# Patient Record
Sex: Female | Born: 1995 | Race: White | Hispanic: Yes | Marital: Married
Health system: Southern US, Community
[De-identification: ages and names within clinical notes are randomized; demographics above are authoritative.]

---

## 2020-01-11 ENCOUNTER — Emergency Department (HOSPITAL_COMMUNITY): Payer: No Typology Code available for payment source

## 2020-01-11 ENCOUNTER — Emergency Department (HOSPITAL_COMMUNITY)
Admission: EM | Admit: 2020-01-11 | Discharge: 2020-01-11 | Disposition: A | Discharge status: Home / Self Care | Payer: No Typology Code available for payment source | Attending: Emergency Medicine | Admitting: Emergency Medicine

## 2020-01-11 ENCOUNTER — Other Ambulatory Visit: Payer: Self-pay

## 2020-01-11 DIAGNOSIS — R0789 Other chest pain: Secondary | ICD-10-CM | POA: Insufficient documentation

## 2020-01-11 DIAGNOSIS — Y9389 Activity, other specified: Secondary | ICD-10-CM | POA: Diagnosis not present

## 2020-01-11 DIAGNOSIS — R1013 Epigastric pain: Secondary | ICD-10-CM | POA: Insufficient documentation

## 2020-01-11 DIAGNOSIS — Y92410 Unspecified street and highway as the place of occurrence of the external cause: Secondary | ICD-10-CM | POA: Diagnosis not present

## 2020-01-11 DIAGNOSIS — Y999 Unspecified external cause status: Secondary | ICD-10-CM | POA: Insufficient documentation

## 2020-01-11 LAB — COMPREHENSIVE METABOLIC PANEL
ALT: 60 U/L — ABNORMAL HIGH (ref 0–44)
AST: 121 U/L — ABNORMAL HIGH (ref 15–41)
Albumin: 3.7 g/dL (ref 3.5–5.0)
Alkaline Phosphatase: 76 U/L (ref 38–126)
Anion gap: 10 (ref 5–15)
BUN: 6 mg/dL (ref 6–20)
CO2: 23 mmol/L (ref 22–32)
Calcium: 8.1 mg/dL — ABNORMAL LOW (ref 8.9–10.3)
Chloride: 110 mmol/L (ref 98–111)
Creatinine, Ser: 0.61 mg/dL (ref 0.44–1.00)
GFR calc Af Amer: >60 mL/min (ref >60)
GFR calc non Af Amer: >60 mL/min (ref >60)
Glucose, Bld: 108 mg/dL — ABNORMAL HIGH (ref 70–99)
Potassium: 3.8 mmol/L (ref 3.5–5.1)
Sodium: 143 mmol/L (ref 135–145)
Total Bilirubin: 0.5 mg/dL (ref 0.3–1.2)
Total Protein: 6.7 g/dL (ref 6.5–8.1)

## 2020-01-11 LAB — CBC
HCT: 38.8 % (ref 36.0–46.0)
Hemoglobin: 12.5 g/dL (ref 12.0–15.0)
MCH: 28.3 pg (ref 26.0–34.0)
MCHC: 32.2 g/dL (ref 30.0–36.0)
MCV: 88 fL (ref 80.0–100.0)
Platelets: 354 10*3/uL (ref 150–400)
RBC: 4.41 MIL/uL (ref 3.87–5.11)
RDW: 13.3 % (ref 11.5–15.5)
WBC: 7 10*3/uL (ref 4.0–10.5)
nRBC: 0 % (ref 0.0–0.2)

## 2020-01-11 LAB — I-STAT BETA HCG BLOOD, ED (MC, WL, AP ONLY): I-stat hCG, quantitative: <5 m[IU]/mL (ref <5)

## 2020-01-11 MED ORDER — IBUPROFEN 600 MG PO TABS: 600.0000 mg | ORAL_TABLET | Freq: Three times a day (TID) | ORAL | 0 refills | Status: AC | PRN

## 2020-01-11 MED ORDER — FENTANYL CITRATE (PF) 100 MCG/2ML IJ SOLN: 50.0000 ug | INTRAMUSCULAR | Status: DC | PRN

## 2020-01-11 MED ORDER — IOHEXOL 300 MG/ML  SOLN
100.0000 mL | Freq: Once | INTRAMUSCULAR | Status: AC | PRN
  Administered 2020-01-11: 100 mL via INTRAVENOUS

## 2020-01-11 NOTE — ED Notes (Signed)
Pt independently ambulatory to the bathroom, with steady gait and no noted distress or difficulty.
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
